# Patient Record
Sex: Male | Born: 1955 | Race: White | Hispanic: No | Marital: Married | Smoking: Never smoker
Health system: Southern US, Community
[De-identification: ages and names within clinical notes are randomized; demographics above are authoritative.]

## PROBLEM LIST (undated history)

## (undated) DIAGNOSIS — I639 Cerebral infarction, unspecified: Secondary | ICD-10-CM

## (undated) DIAGNOSIS — I4891 Unspecified atrial fibrillation: Secondary | ICD-10-CM

## (undated) DIAGNOSIS — I2510 Atherosclerotic heart disease of native coronary artery without angina pectoris: Secondary | ICD-10-CM

## (undated) DIAGNOSIS — E119 Type 2 diabetes mellitus without complications: Secondary | ICD-10-CM

## (undated) HISTORY — DX: Type 2 diabetes mellitus without complications: E11.9

## (undated) HISTORY — DX: Cerebral infarction, unspecified: I63.9

## (undated) HISTORY — PX: MANDIBLE FRACTURE SURGERY: SHX706

## (undated) HISTORY — PX: ATRIAL CARDIAC PACEMAKER INSERTION: SHX561

## (undated) HISTORY — PX: IMPLANTABLE CARDIOVERTER DEFIBRILLATOR IMPLANT: SHX5860

---

## 2011-05-30 DIAGNOSIS — Z87442 Personal history of urinary calculi: Secondary | ICD-10-CM | POA: Insufficient documentation

## 2011-05-30 DIAGNOSIS — G4700 Insomnia, unspecified: Secondary | ICD-10-CM | POA: Insufficient documentation

## 2011-05-30 DIAGNOSIS — G4733 Obstructive sleep apnea (adult) (pediatric): Secondary | ICD-10-CM | POA: Insufficient documentation

## 2011-05-30 DIAGNOSIS — F331 Major depressive disorder, recurrent, moderate: Secondary | ICD-10-CM | POA: Insufficient documentation

## 2011-05-30 DIAGNOSIS — Z95810 Presence of automatic (implantable) cardiac defibrillator: Secondary | ICD-10-CM | POA: Insufficient documentation

## 2011-05-30 DIAGNOSIS — I472 Ventricular tachycardia, unspecified: Secondary | ICD-10-CM | POA: Insufficient documentation

## 2011-11-04 DIAGNOSIS — D128 Benign neoplasm of rectum: Secondary | ICD-10-CM | POA: Insufficient documentation

## 2018-08-07 ENCOUNTER — Encounter (HOSPITAL_COMMUNITY): Payer: Self-pay | Admitting: Emergency Medicine

## 2018-08-07 ENCOUNTER — Emergency Department (HOSPITAL_COMMUNITY)
Admission: EM | Admit: 2018-08-07 | Discharge: 2018-08-07 | Disposition: A | Discharge status: Home / Self Care | Payer: BLUE CROSS/BLUE SHIELD | Attending: Emergency Medicine | Admitting: Emergency Medicine

## 2018-08-07 ENCOUNTER — Other Ambulatory Visit: Payer: Self-pay

## 2018-08-07 ENCOUNTER — Emergency Department (HOSPITAL_COMMUNITY): Payer: BLUE CROSS/BLUE SHIELD

## 2018-08-07 DIAGNOSIS — Z7901 Long term (current) use of anticoagulants: Secondary | ICD-10-CM | POA: Insufficient documentation

## 2018-08-07 DIAGNOSIS — I259 Chronic ischemic heart disease, unspecified: Secondary | ICD-10-CM | POA: Insufficient documentation

## 2018-08-07 DIAGNOSIS — Z95810 Presence of automatic (implantable) cardiac defibrillator: Secondary | ICD-10-CM | POA: Insufficient documentation

## 2018-08-07 DIAGNOSIS — R071 Chest pain on breathing: Secondary | ICD-10-CM | POA: Insufficient documentation

## 2018-08-07 DIAGNOSIS — I4891 Unspecified atrial fibrillation: Secondary | ICD-10-CM | POA: Diagnosis not present

## 2018-08-07 DIAGNOSIS — R079 Chest pain, unspecified: Secondary | ICD-10-CM | POA: Diagnosis present

## 2018-08-07 HISTORY — DX: Atherosclerotic heart disease of native coronary artery without angina pectoris: I25.10

## 2018-08-07 HISTORY — DX: Unspecified atrial fibrillation: I48.91

## 2018-08-07 LAB — BASIC METABOLIC PANEL
Anion gap: 6 (ref 5–15)
BUN: 13 mg/dL (ref 8–23)
CO2: 31 mmol/L (ref 22–32)
Calcium: 9.2 mg/dL (ref 8.9–10.3)
Chloride: 101 mmol/L (ref 98–111)
Creatinine, Ser: 1.19 mg/dL (ref 0.61–1.24)
GFR calc non Af Amer: >60 mL/min (ref >60)
Glucose, Bld: 233 mg/dL — ABNORMAL HIGH (ref 70–99)
Potassium: 4.4 mmol/L (ref 3.5–5.1)
Sodium: 138 mmol/L (ref 135–145)

## 2018-08-07 LAB — CBC
HCT: 47.2 % (ref 39.0–52.0)
Hemoglobin: 14.7 g/dL (ref 13.0–17.0)
MCH: 29.5 pg (ref 26.0–34.0)
MCHC: 31.1 g/dL (ref 30.0–36.0)
MCV: 94.6 fL (ref 78.0–100.0)
PLATELETS: 200 10*3/uL (ref 150–400)
RBC: 4.99 MIL/uL (ref 4.22–5.81)
RDW: 12.3 % (ref 11.5–15.5)
WBC: 5.6 10*3/uL (ref 4.0–10.5)

## 2018-08-07 LAB — I-STAT TROPONIN, ED
Troponin i, poc: 0.01 ng/mL (ref 0.00–0.08)
Troponin i, poc: 0.01 ng/mL (ref 0.00–0.08)

## 2018-08-07 LAB — BRAIN NATRIURETIC PEPTIDE: B Natriuretic Peptide: 11.7 pg/mL (ref 0.0–100.0)

## 2018-08-07 LAB — PROTIME-INR
INR: 0.92
Prothrombin Time: 12.3 seconds (ref 11.4–15.2)

## 2018-08-07 NOTE — ED Triage Notes (Signed)
Pt with stabbing pain and pressure in on left side chest w/o radiation this morning upon waking up. Hx ACID pacemaker and ablations. Pt a/o NAD at triage. Labs collected.

## 2018-08-07 NOTE — ED Provider Notes (Signed)
Patient placed in Quick Look pathway, seen and evaluated   Chief Complaint: chest pain  HPI:   Zachary Hines is a 62 y.o. male with hx of A-Fib and cardiac ablation who presents to the ED with chest pain. Patient reports that since th ablation his defibrillator has not fired. The pain the pain today is located in the left chest area. The pain started at 6:30 am and has gotten some better. Patient reports that his son is a cardiac nurse at [employer] and he told the patient to come to the ED for evaluation. Patient describes the pain as pushing, pressure. No n/v, no shortness of breath.  ROS: CV: chest pain  Physical Exam:  BP 132/82 (BP Location: Right Arm)   Pulse 85   Temp 98.2 F (36.8 C) (Oral)   Resp 16   SpO2 95%     Gen: No distress  Neuro: Awake and Alert  Skin: Warm and dry     Initiation of care has begun. The patient has been counseled on the process, plan, and necessity for staying for the completion/evaluation, and the remainder of the medical screening examination    Janne Napoleoneese, Fallen Crisostomo M, NP 08/07/18 1401    Arby BarrettePfeiffer, Marcy, MD 08/24/18 1359

## 2018-08-07 NOTE — ED Provider Notes (Signed)
[facility] Provider Note   CSN: [id number] Arrival date & time: 08/07/18  1333     History   Chief Complaint Chief Complaint  Patient presents with  . Chest Pain    HPI Zachary Hines is a 62 y.o. male.  The history is provided by the patient and a relative (son: cardiac nurse at [employer]).  Chest Pain   This is a new problem. The current episode started 6 to 12 hours ago. The problem occurs constantly. The problem has been gradually improving. The pain is associated with breathing. The pain is present in the lateral region (left). The pain is at a severity of 1/10. The pain is mild. The quality of the pain is described as dull and sharp (dull pain with a sharp component when patient took a deep breath). The pain does not radiate. The symptoms are aggravated by deep breathing. Pertinent negatives include no cough, no fever, no headaches, no nausea, no near-syncope, no shortness of breath and no sputum production. Treatments tried: Aspirin. The treatment provided no (patient reports pain was already improving when he took ASA) relief. Risk factors include male gender and obesity (and PMH PE). Family history comments: grandfather had lung cancer  Procedure history is positive for cardiac catheterization (per son, clean cath. Patient has no stents).    Past Medical History:  Diagnosis Date  . Atrial fibrillation (HCC)   . Coronary artery disease     There are no active problems to display for this patient.   Past Surgical History:  Procedure Laterality Date  . ATRIAL CARDIAC PACEMAKER INSERTION    . IMPLANTABLE CARDIOVERTER DEFIBRILLATOR IMPLANT    . MANDIBLE FRACTURE SURGERY        Home Medications    Prior to Admission medications   Not on File    Family History No family history on file.  Social History Social History   Tobacco Use  . Smoking status: Never Smoker  . Smokeless tobacco: Never Used  Substance Use Topics  .  Alcohol use: Not Currently  . Drug use: Not Currently     Allergies   Patient has no known allergies.   Review of Systems Review of Systems  Constitutional: Negative for chills and fever.  HENT: Negative for congestion.   Eyes: Negative for pain.  Respiratory: Negative for cough, sputum production and shortness of breath.   Cardiovascular: Positive for chest pain. Negative for near-syncope.  Gastrointestinal: Negative for nausea.  Genitourinary: Negative for flank pain.  Musculoskeletal: Positive for arthralgias (left lower leg pain).  Skin: Positive for wound (abrasion to left medial ankle).  Neurological: Negative for headaches.     Physical Exam Updated Vital Signs BP 128/83   Pulse 74   Temp 98.2 F (36.8 C) (Oral)   Resp 20   Ht 5\' 10"  (1.778 m)   Wt 120.2 kg   SpO2 94%   BMI 38.02 kg/m   Physical Exam  Constitutional: He appears well-developed and well-nourished.  HENT:  Head: Normocephalic and atraumatic.  Eyes: Conjunctivae are normal.  Neck: Neck supple.  Cardiovascular: Normal rate and regular rhythm.  No murmur heard. Pulmonary/Chest: Effort normal. No respiratory distress. He has decreased breath sounds. He has no wheezes. He has no rales.  Abdominal: Soft. There is no tenderness.  Musculoskeletal:       Right lower leg: He exhibits edema (2+).       Left lower leg: He exhibits edema (2+).  Neurological: He is alert.  Skin: Skin is warm and dry.  Psychiatric: He has a normal mood and affect.  Nursing note and vitals reviewed.    ED Treatments / Results  Labs (all labs ordered are listed, but only abnormal results are displayed) Labs Reviewed  BASIC METABOLIC PANEL - Abnormal; Notable for the following components:      Result Value   Glucose, Bld 233 (*)    All other components within normal limits  CBC  PROTIME-INR  BRAIN NATRIURETIC PEPTIDE  I-STAT TROPONIN, ED  I-STAT TROPONIN, ED    EKG EKG Interpretation  Date/Time:  Friday  August 07 2018 13:42:05 EDT Ventricular Rate:  90 PR Interval:  168 QRS Duration: 82 QT Interval:  358 QTC Calculation: 437 R Axis:   43 Text Interpretation:  Normal sinus rhythm Normal ECG No old tracing to compare Confirmed by Azalia Bilis ([id number]) on 08/07/2018 6:09:41 PM   Radiology Dg Chest 2 View  Result Date: 08/07/2018 CLINICAL DATA:  Left-sided chest pain. EXAM: CHEST - 2 VIEW COMPARISON:  None. FINDINGS: Single lead left chest wall pacemaker. The heart is at the upper limits of normal in size. Normal pulmonary vascularity. Low lung volumes with bibasilar atelectasis. No focal consolidation, pleural effusion, or pneumothorax. No acute osseous abnormality. IMPRESSION: Bibasilar atelectasis. Electronically Signed   By: Obie Dredge M.D.   On: 08/07/2018 15:01    Procedures Procedures (including critical care time)  Medications Ordered in ED Medications - No data to display   Initial Impression / Assessment and Plan / ED Course  I have reviewed the triage vital signs and the nursing notes.  Pertinent labs & imaging results that were available during my care of the patient were reviewed by me and considered in my medical decision making (see chart for details).      The patient is a 61yoM with PMH Afib s/p ablation and PE (on Xarelto) who presents with chest pain on awakening. Patient took 2 325mg  tablets of aspirin. The patient reports that the pain has improved over the course of the day and the only component he has now is a sharp component when taking a deep breath. Delta troponin negative and EKG shows no STEMI, not suspect ACS. HEART score less than 3. Per 06/2018 cardiology office visit with Dr. Beatrix Fetters, in 2014, patient had a coronary angiogram after having several runs of VT, which demonstrated no significant CAD. Do not suspect PE as patient is anticoagulated, has no recent surgeries, cancers, HR less than 100 and SpO2 greater than 95%. The patient denies intractable  emesis or hematemesis, do not suspect esophageal perforation. Patient has equal pulses and no widened mediastinum on CXR, do not suspect aortic dissection. CXR reveals no PNA and patient denies fevers, chills, coughing, and shortness of breath.  Do not suspect emergent etiology of chest pain. Patient informed of results and patient agrees to follow up with PCP and cardiology as soon as possible. Patient reports understanding of plan and return precautions.  Care supervised by Dr. Patria Mane.  Nash Dimmer, MD  Final Clinical Impressions(s) / ED Diagnoses   Final diagnoses:  Chest pain on breathing    ED Discharge Orders    None       Nash Dimmer, MD 08/08/18 1259    Azalia Bilis, MD 08/10/18 2314

## 2018-08-07 NOTE — ED Notes (Signed)
Recollect Blue top per lab.

## 2020-01-31 IMAGING — DX DG CHEST 2V
2 series · 2 of 2 positions shown · non-contrast
Comparison: None.

CLINICAL DATA: Left-sided chest pain.

EXAM:
CHEST - 2 VIEW

[chest pa]
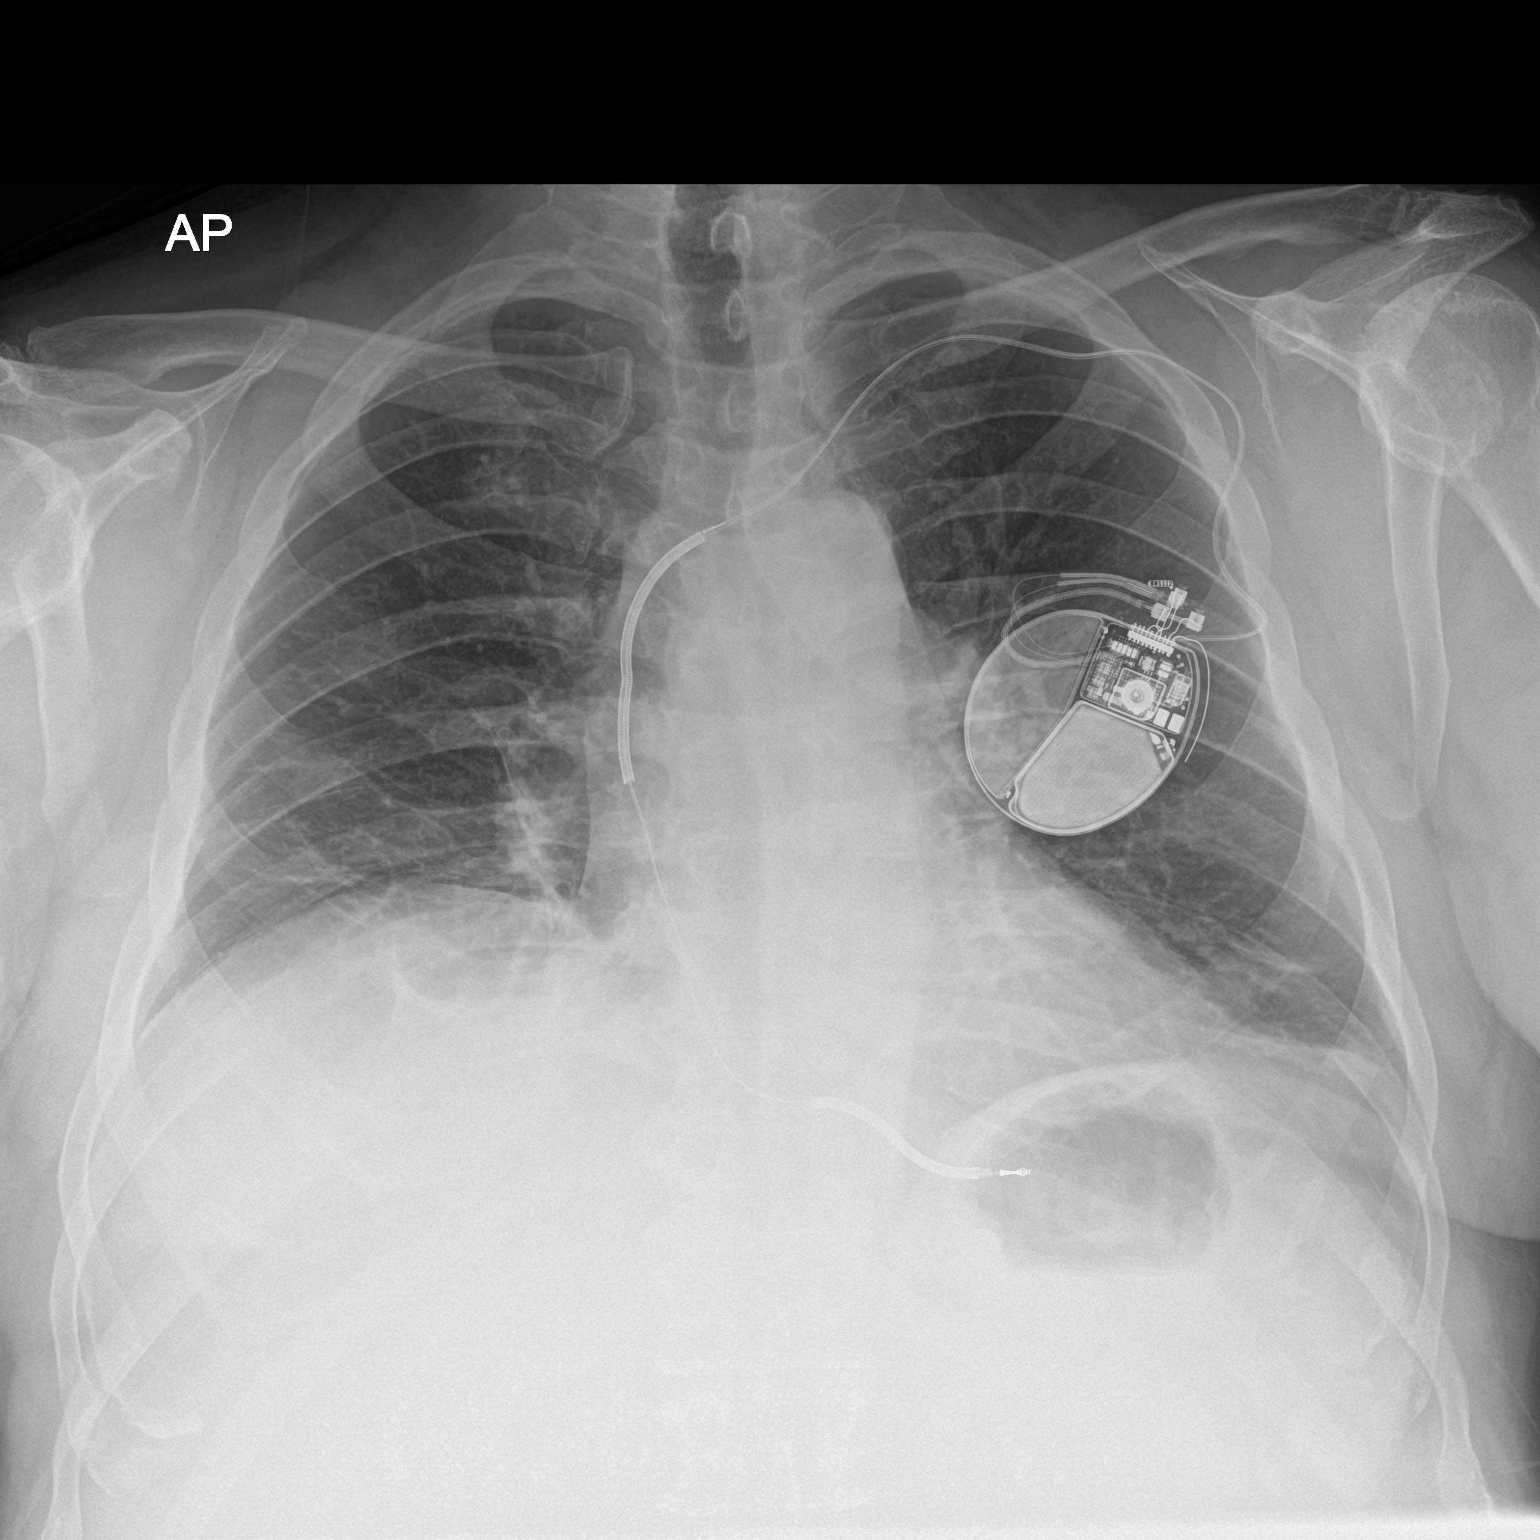

[chest lat]
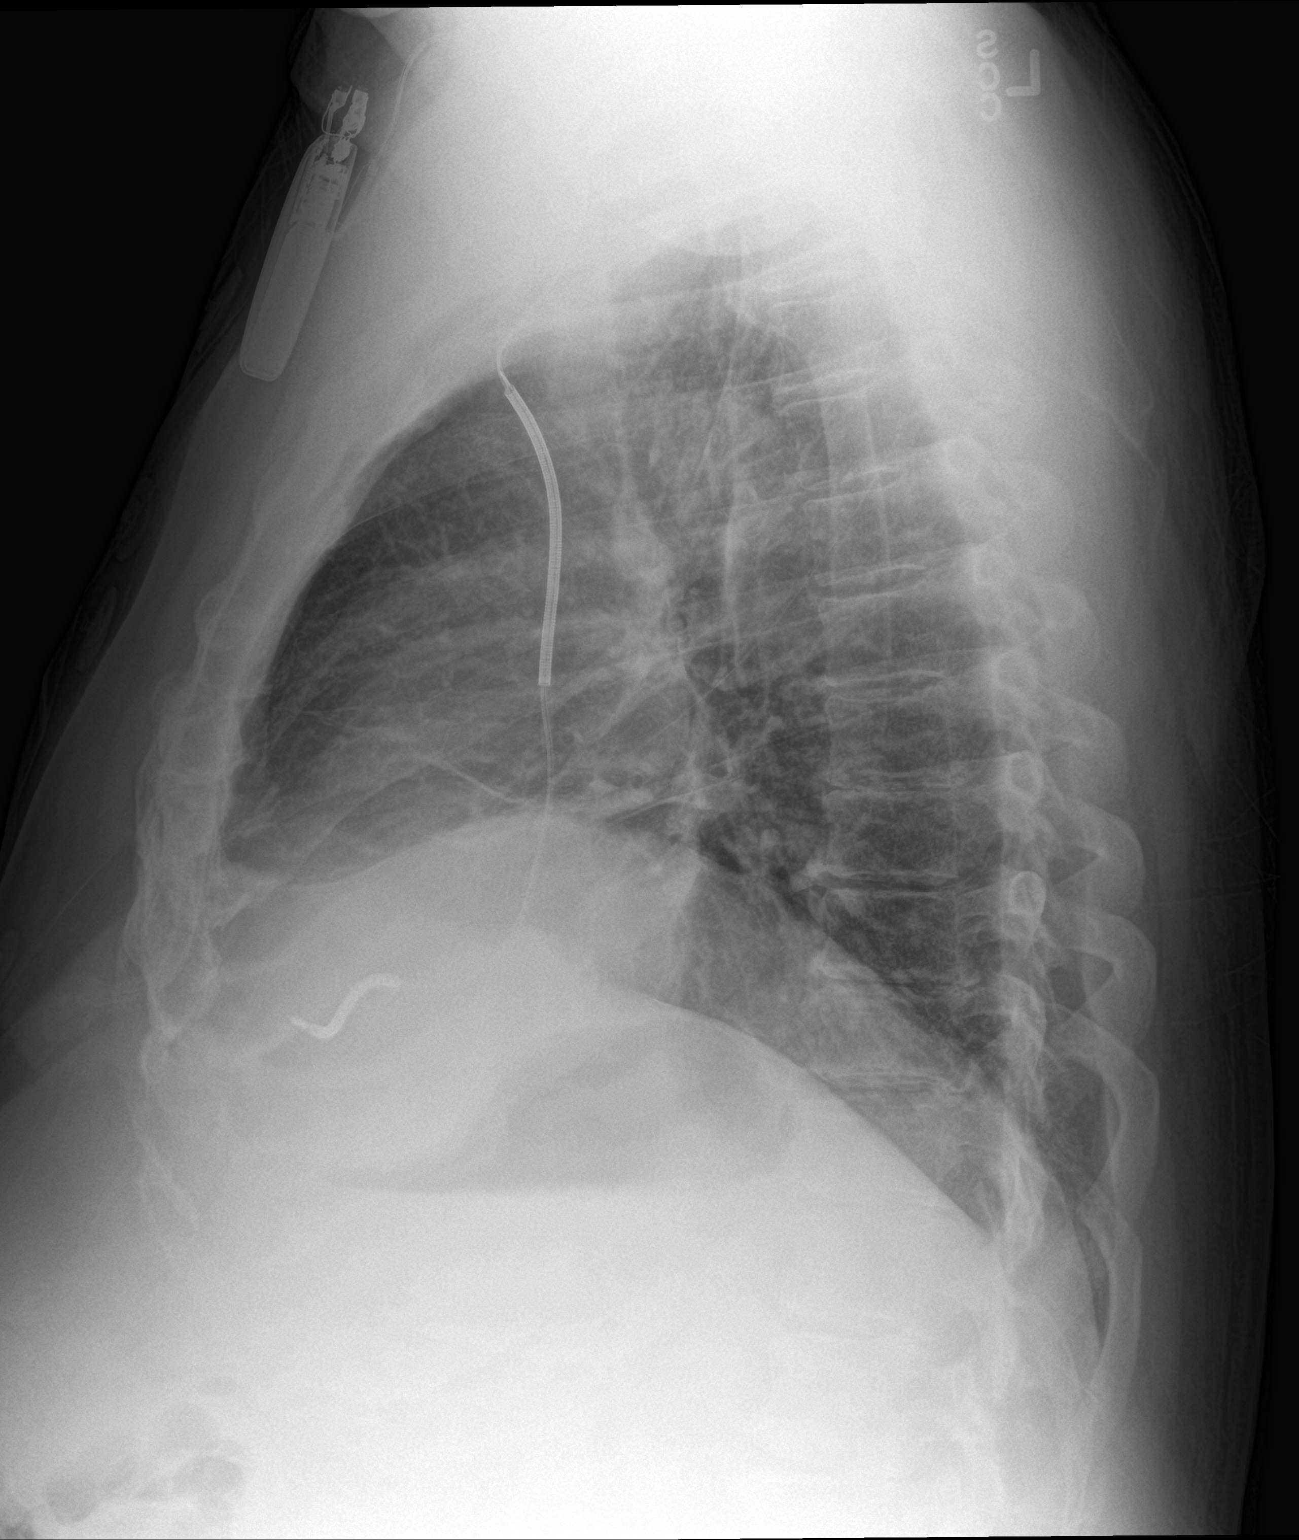

[2 of 2 positions shown; findings below may reference images not displayed]

FINDINGS: Single lead left chest wall pacemaker. The heart is at the upper
limits of normal in size. Normal pulmonary vascularity. Low lung
volumes with bibasilar atelectasis. No focal consolidation, pleural
effusion, or pneumothorax. No acute osseous abnormality.
IMPRESSION: Bibasilar atelectasis.

## 2021-01-29 DIAGNOSIS — I10 Essential (primary) hypertension: Secondary | ICD-10-CM | POA: Insufficient documentation

## 2021-02-04 DIAGNOSIS — E1165 Type 2 diabetes mellitus with hyperglycemia: Secondary | ICD-10-CM | POA: Insufficient documentation

## 2021-06-19 DIAGNOSIS — G8929 Other chronic pain: Secondary | ICD-10-CM | POA: Insufficient documentation

## 2021-06-19 DIAGNOSIS — G5601 Carpal tunnel syndrome, right upper limb: Secondary | ICD-10-CM | POA: Insufficient documentation

## 2022-01-17 DIAGNOSIS — G459 Transient cerebral ischemic attack, unspecified: Secondary | ICD-10-CM | POA: Insufficient documentation

## 2024-02-20 ENCOUNTER — Encounter: Payer: Self-pay | Admitting: Nurse Practitioner

## 2024-02-20 ENCOUNTER — Ambulatory Visit (INDEPENDENT_AMBULATORY_CARE_PROVIDER_SITE_OTHER): Admitting: Nurse Practitioner

## 2024-02-20 VITALS — BP 104/64 | HR 70 | Temp 97.6°F | Resp 16 | Ht 70.0 in | Wt 185.0 lb

## 2024-02-20 DIAGNOSIS — F331 Major depressive disorder, recurrent, moderate: Secondary | ICD-10-CM

## 2024-02-20 DIAGNOSIS — Z1212 Encounter for screening for malignant neoplasm of rectum: Secondary | ICD-10-CM

## 2024-02-20 DIAGNOSIS — E1169 Type 2 diabetes mellitus with other specified complication: Secondary | ICD-10-CM

## 2024-02-20 DIAGNOSIS — Z1211 Encounter for screening for malignant neoplasm of colon: Secondary | ICD-10-CM

## 2024-02-20 DIAGNOSIS — G4709 Other insomnia: Secondary | ICD-10-CM

## 2024-02-20 DIAGNOSIS — Z8673 Personal history of transient ischemic attack (TIA), and cerebral infarction without residual deficits: Secondary | ICD-10-CM

## 2024-02-20 DIAGNOSIS — E785 Hyperlipidemia, unspecified: Secondary | ICD-10-CM

## 2024-02-20 NOTE — Progress Notes (Signed)
 [facility] [address] [place], [place] [address]  Internal MEDICINE  Office Visit Note  Patient Name: Zachary Hines  [id number]  [id number]  Date of Service: 02/20/2024   Complaints/HPI Pt is here for establishment of PCP. Chief Complaint  Patient presents with   New Patient (Initial Visit)   Diabetes   Depression    HPI Zachary Hines presents for a new patient visit to establish care.  Well-appearing 68 y.o. male with diabetes, hyperlipidemia, hypertension, CHF, persistent atrial fib, OSA s/p maxillomandibular advancement surgery, issues with bowel and bladder incontinence  Work: retired Home:lives at home with husband Diet: fair Exercise: walking Tobacco use: none Alcohol use: none  Illicit drug use: none Routine CRC screening: not sure when this is due Eye exam: reports that he has an eye doctor foot exam: will be done with annual visit.  Labs: had labs done recently New or worsening pain: none Has a single chamber ICD, also had an AFIB ablation in 2019, getting ICD battery changed out next year Severe depression, needs psych referral AWV done in January this year .   Current Medication: Outpatient Encounter Medications as of 02/20/2024  Medication Sig   aspirin 81 MG chewable tablet Chew 81 mg by mouth daily.   [DISCONTINUED] empagliflozin (JARDIANCE) 25 MG TABS tablet Take 25 mg by mouth daily.   [DISCONTINUED] liraglutide (VICTOZA) 18 MG/3ML SOPN Inject 0.6 mg into the skin daily.   [DISCONTINUED] sertraline (ZOLOFT) 100 MG tablet Take 100 mg by mouth daily.   [DISCONTINUED] traZODone (DESYREL) 100 MG tablet Take 100 mg by mouth at bedtime.   empagliflozin (JARDIANCE) 25 MG TABS tablet Take 1 tablet (25 mg total) by mouth daily.   liraglutide (VICTOZA) 18 MG/3ML SOPN Inject 1.2 mg into the skin daily.   sertraline (ZOLOFT) 100 MG tablet Take 1 tablet (100 mg total) by mouth daily.   traZODone (DESYREL) 100 MG tablet Take 1 tablet (100 mg total) by mouth at  bedtime.   No facility-administered encounter medications on file as of 02/20/2024.    Surgical History: Past Surgical History:  Procedure Laterality Date   ATRIAL CARDIAC PACEMAKER INSERTION     IMPLANTABLE CARDIOVERTER DEFIBRILLATOR IMPLANT     MANDIBLE FRACTURE SURGERY      Medical History: Past Medical History:  Diagnosis Date   Atrial fibrillation (HCC)    Coronary artery disease    Diabetes mellitus without complication (HCC)    Stroke (HCC)     Family History: History reviewed. No pertinent family history.  Social History   Socioeconomic History   Marital status: Married    Spouse name: Not on file   Number of children: Not on file   Years of education: Not on file   Highest education level: Not on file  Occupational History   Not on file  Tobacco Use   Smoking status: Never   Smokeless tobacco: Never  Substance and Sexual Activity   Alcohol use: Not Currently   Drug use: Not Currently   Sexual activity: Not Currently  Other Topics Concern   Not on file  Social History Narrative   Not on file   Social Drivers of Health   Financial Resource Strain: Low Risk  (09/20/2022)   Received from [facility]   Overall Financial Resource Strain (CARDIA)    Difficulty of Paying Living Expenses: Not hard at all  Food Insecurity: No Food Insecurity (09/20/2022)   Received from [facility]   Hunger Vital Sign  Worried About Programme researcher, broadcasting/film/video in the Last Year: Never true    Ran Out of Food in the Last Year: Never true  Transportation Needs: No Transportation Needs (09/20/2022)   Received from [facility] - Transportation    Lack of Transportation (Medical): No    Lack of Transportation (Non-Medical): No  Physical Activity: Not on file  Stress: Not on file  Social Connections: Socially Integrated (01/18/2022)   Received from [facility]   Social Connection and Isolation Panel [NHANES]    Frequency of Communication with Friends and  Family: More than three times a week    Frequency of Social Gatherings with Friends and Family: Never    Attends Religious Services: More than 4 times per year    Active Member of [employer] or Organizations: Yes    Attends Banker Meetings: Never    Marital Status: Married  Catering manager Violence: Not At Risk (01/18/2022)   Received from [facility]   Humiliation, Afraid, Rape, and Kick questionnaire    Fear of Current or Ex-Partner: No    Emotionally Abused: No    Physically Abused: No    Sexually Abused: No     Review of Systems  Constitutional: Negative.  Negative for chills, fatigue and unexpected weight change.  HENT:  Negative for congestion, postnasal drip, rhinorrhea, sneezing and sore throat.   Eyes:  Negative for redness.  Respiratory: Negative.  Negative for cough, chest tightness, shortness of breath and wheezing.   Cardiovascular: Negative.  Negative for chest pain and palpitations.  Gastrointestinal: Negative.  Negative for abdominal pain, constipation, diarrhea, nausea and vomiting.  Genitourinary:  Negative for dysuria and frequency.  Musculoskeletal: Negative.  Negative for arthralgias, back pain, joint swelling and neck pain.  Skin:  Negative for rash.  Neurological: Negative.  Negative for tremors and numbness.  Hematological:  Negative for adenopathy. Does not bruise/bleed easily.  Psychiatric/Behavioral:  Negative for behavioral problems (Depression), sleep disturbance and suicidal ideas. The patient is not nervous/anxious.     Vital Signs: BP 104/64   Pulse 70   Temp 97.6 F (36.4 C)   Resp 16   Ht 5\' 10"  (1.778 m)   Wt 185 lb (83.9 kg)   SpO2 96%   BMI 26.54 kg/m    Physical Exam Vitals reviewed.  Constitutional:      General: He is not in acute distress.    Appearance: Normal appearance. He is well-developed, well-groomed and overweight. He is not ill-appearing.  HENT:     Head: Normocephalic and atraumatic.  Eyes:     Pupils:  Pupils are equal, round, and reactive to light.  Cardiovascular:     Rate and Rhythm: Normal rate and regular rhythm.  Pulmonary:     Effort: Pulmonary effort is normal. No respiratory distress.  Skin:    Capillary Refill: Capillary refill takes less than 2 seconds.  Neurological:     Mental Status: He is alert and oriented to person, place, and time.  Psychiatric:        Mood and Affect: Mood normal.        Behavior: Behavior normal.       Assessment/Plan: 1. Type 2 diabetes mellitus with other specified complication, without long-term current use of insulin (HCC) (Primary) Stable A1c at 5.1 in January. Continue jardiance and victoza as prescribed. Labs ordered for next office visit.  - empagliflozin (JARDIANCE) 25 MG TABS tablet; Take 1 tablet (25 mg total) by  mouth daily.  Dispense: 30 tablet; Refill: 11 - liraglutide (VICTOZA) 18 MG/3ML SOPN; Inject 1.2 mg into the skin daily.  Dispense: 3 mL; Refill: 11 - CBC with Differential/Platelet - CMP14+EGFR - Urine Microalbumin w/creat. ratio  2. Hyperlipidemia associated with type 2 diabetes mellitus (HCC) Not currently on statin therapy. Continue diet and lifestyle modifications   3. Other insomnia Continue trazodone as prescribed.  - traZODone (DESYREL) 100 MG tablet; Take 1 tablet (100 mg total) by mouth at bedtime.  Dispense: 90 tablet; Refill: 3  4. History of TIA (transient ischemic attack) Routine labs ordered. Continue baby aspirin as prescribed.  - aspirin 81 MG chewable tablet; Chew 81 mg by mouth daily. - CBC with Differential/Platelet - CMP14+EGFR - Urine Microalbumin w/creat. ratio  5. Screening for colorectal cancer Cologuard test ordered  - Cologuard  6. Moderate episode of recurrent major depressive disorder (HCC) Referred to psychiatry. Continue sertraline and trazodone as prescribed  - Ambulatory referral to Psychiatry - sertraline (ZOLOFT) 100 MG tablet; Take 1 tablet (100 mg total) by mouth daily.   Dispense: 90 tablet; Refill: 3 - traZODone (DESYREL) 100 MG tablet; Take 1 tablet (100 mg total) by mouth at bedtime.  Dispense: 90 tablet; Refill: 3    General Counseling: Zachary Hines verbalizes understanding of the findings of todays visit and agrees with plan of treatment. I have discussed any further diagnostic evaluation that may be needed or ordered today. We also reviewed his medications today. he has been encouraged to call the office with any questions or concerns that should arise related to todays visit.    Orders Placed This Encounter  Procedures   Cologuard   Ambulatory referral to Psychiatry    Meds ordered this encounter  Medications   empagliflozin (JARDIANCE) 25 MG TABS tablet    Sig: Take 1 tablet (25 mg total) by mouth daily.    Dispense:  30 tablet    Refill:  11    Discontinue all previous orders for jardiance and keep this script on file for future refills. I am taking over as his PCP and his prescriptions will come from me.   liraglutide (VICTOZA) 18 MG/3ML SOPN    Sig: Inject 1.2 mg into the skin daily.    Dispense:  3 mL    Refill:  11    Discontinue all previous orders for victoza, keep this script on file for future refills. I am taking over his prescriptions as his PCP   sertraline (ZOLOFT) 100 MG tablet    Sig: Take 1 tablet (100 mg total) by mouth daily.    Dispense:  90 tablet    Refill:  3    Discontinue all previous orders for sertraline, keep this script on file for future refills. I am taking over his prescriptions as his PCP   traZODone (DESYREL) 100 MG tablet    Sig: Take 1 tablet (100 mg total) by mouth at bedtime.    Dispense:  90 tablet    Refill:  3    Discontinue all previous orders for trazodone, keep this script on file for future refills. I am taking over his prescriptions as his PCP    Return in about 6 months (around 08/22/2024) for F/U, Recheck A1C, Amanii Snethen PCP.  Time spent:30 Minutes Time spent with patient included reviewing progress  notes, labs, imaging studies, and discussing plan for follow up.   [place] Controlled Substance Database was reviewed by me for overdose risk score (ORS)   This patient was seen by  Laurence Pons, FNP-C in collaboration with Dr. Verneta Gone as a part of collaborative care agreement.   Jaidon Sponsel R. Bobbi Burow, MSN, FNP-C Internal Medicine

## 2024-03-16 NOTE — Progress Notes (Deleted)
 Psychiatric Initial Adult Assessment   Patient Identification: Zachary Hines MRN:  [id number] Date of Evaluation:  03/16/2024 Referral Source: *** Chief Complaint:  No chief complaint on file.  Visit Diagnosis: No diagnosis found.  History of Present Illness:   Zachary Hines is a 68 y.o. 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    Sertraline 150 mg daily  Was seen at [facility]    Associated Signs/Symptoms: Depression Symptoms:  {DEPRESSION SYMPTOMS:20000} (Hypo) Manic Symptoms:  {BHH MANIC SYMPTOMS:22872} Anxiety Symptoms:  {BHH ANXIETY SYMPTOMS:22873} Psychotic Symptoms:  {BHH PSYCHOTIC SYMPTOMS:22874} PTSD Symptoms: {BHH PTSD SYMPTOMS:22875}  Past Psychiatric History:  Outpatient:  Psychiatry admission:  Previous suicide attempt:  Past trials of medication:  History of violence:  History of head injury:   Previous Psychotropic Medications: {YES/NO:21197}  Substance Abuse History in the last 12 months:  {yes no:314532}  Consequences of Substance Abuse: {BHH CONSEQUENCES OF SUBSTANCE ABUSE:22880}  Past Medical History:  Past Medical History:  Diagnosis Date   Atrial fibrillation (HCC)    Coronary artery disease    Diabetes mellitus without complication (HCC)    Stroke (HCC)     Past Surgical History:  Procedure Laterality Date   ATRIAL CARDIAC PACEMAKER INSERTION     IMPLANTABLE CARDIOVERTER DEFIBRILLATOR IMPLANT     MANDIBLE FRACTURE SURGERY      Family Psychiatric History: ***  Family History: No family history on file.  Social History:   Social History   Socioeconomic History   Marital status: Married    Spouse name: Not on file   Number of children: Not on file   Years of education: Not on file   Highest education level: Not on file  Occupational History   Not on file  Tobacco Use   Smoking status: Never   Smokeless tobacco: Never  Substance and Sexual Activity    Alcohol use: Not Currently   Drug use: Not Currently   Sexual activity: Not Currently  Other Topics Concern   Not on file  Social History Narrative   Not on file   Social Drivers of Health   Financial Resource Strain: Low Risk  (09/20/2022)   Received from [facility]   Overall Financial Resource Strain (CARDIA)    Difficulty of Paying Living Expenses: Not hard at all  Food Insecurity: No Food Insecurity (09/20/2022)   Received from [facility]   Hunger Vital Sign    Worried About Running Out of Food in the Last Year: Never true    Ran Out of Food in the Last Year: Never true  Transportation Needs: No Transportation Needs (09/20/2022)   Received from [facility] - Transportation    Lack of Transportation (Medical): No    Lack of Transportation (Non-Medical): No  Physical Activity: Not on file  Stress: Not on file  Social Connections: Socially Integrated (01/18/2022)   Received from [facility]   Social Connection and Isolation Panel [NHANES]    Frequency of Communication with Friends and Family: More than three times a week    Frequency of Social Gatherings with Friends and Family: Never    Attends Religious Services: More than 4 times per year    Active Member of [employer] or Organizations: Yes    Attends Banker Meetings: Never    Marital Status: Married    Additional Social History: ***  Allergies:  No Known Allergies  Metabolic  Disorder Labs: No results found for: "HGBA1C", "MPG" No results found for: "PROLACTIN" No results found for: "CHOL", "TRIG", "HDL", "CHOLHDL", "VLDL", "LDLCALC" No results found for: "TSH"  Therapeutic Level Labs: No results found for: "LITHIUM" No results found for: "CBMZ" No results found for: "VALPROATE"  Current Medications: Current Outpatient Medications  Medication Sig Dispense Refill   aspirin 81 MG chewable tablet Chew 81 mg by mouth daily.     empagliflozin (JARDIANCE) 25 MG TABS tablet Take  25 mg by mouth daily.     liraglutide (VICTOZA) 18 MG/3ML SOPN Inject 0.6 mg into the skin daily.     sertraline (ZOLOFT) 100 MG tablet Take 100 mg by mouth daily.     traZODone (DESYREL) 100 MG tablet Take 100 mg by mouth at bedtime.     No current facility-administered medications for this visit.    Musculoskeletal: Strength & Muscle Tone: within normal limits Gait & Station: normal Patient leans: N/A  Psychiatric Specialty Exam: Review of Systems  There were no vitals taken for this visit.There is no height or weight on file to calculate BMI.  General Appearance: {Appearance:22683}  Eye Contact:  {BHH EYE CONTACT:22684}  Speech:  Clear and Coherent  Volume:  Normal  Mood:  {BHH MOOD:22306}  Affect:  {Affect (PAA):22687}  Thought Process:  Coherent  Orientation:  Full (Time, Place, and Person)  Thought Content:  Logical  Suicidal Thoughts:  {ST/HT (PAA):22692}  Homicidal Thoughts:  {ST/HT (PAA):22692}  Memory:  Immediate;   Good  Judgement:  {Judgement (PAA):22694}  Insight:  {Insight (PAA):22695}  Psychomotor Activity:  Normal  Concentration:  Concentration: Good and Attention Span: Good  Recall:  Good  Fund of Knowledge:Good  Language: Good  Akathisia:  No  Handed:  Right  AIMS (if indicated):  not done  Assets:  Communication Skills Desire for Improvement  ADL's:  Intact  Cognition: WNL  Sleep:  {BHH GOOD/FAIR/POOR:22877}   Screenings:   Assessment and Plan:    Plan   The patient demonstrates the following risk factors for suicide: Chronic risk factors for suicide include: {Chronic Risk Factors for WUJWJXB:14782956}. Acute risk factors for suicide include: {Acute Risk Factors for OZHYQMV:78469629}. Protective factors for this patient include: {Protective Factors for Suicide BMWU:13244010}. Considering these factors, the overall suicide risk at this point appears to be {Desc; low/moderate/high:110033}. Patient {ACTION; IS/IS UVO:53664403} appropriate for  outpatient follow up.   Collaboration of Care: {BH OP Collaboration of Care:21014065}  Patient/Guardian was advised Release of Information must be obtained prior to any record release in order to collaborate their care with an outside provider. Patient/Guardian was advised if they have not already done so to contact the registration department to sign all necessary forms in order for [place] to release information regarding their care.   Consent: Patient/Guardian gives verbal consent for treatment and assignment of benefits for services provided during this visit. Patient/Guardian expressed understanding and agreed to proceed.   Neysa Hotter, MD 4/8/20258:15 AM

## 2024-03-22 ENCOUNTER — Ambulatory Visit: Admitting: Psychiatry

## 2024-04-03 ENCOUNTER — Encounter: Payer: Self-pay | Admitting: Nurse Practitioner

## 2024-04-03 DIAGNOSIS — Z8673 Personal history of transient ischemic attack (TIA), and cerebral infarction without residual deficits: Secondary | ICD-10-CM | POA: Insufficient documentation

## 2024-04-03 DIAGNOSIS — E119 Type 2 diabetes mellitus without complications: Secondary | ICD-10-CM | POA: Insufficient documentation

## 2024-04-03 LAB — ESTIMATED GFR: eGFR: >90

## 2024-04-03 LAB — HEMOGLOBIN A1C: Hemoglobin A1C: 5.1

## 2024-04-03 MED ORDER — EMPAGLIFLOZIN 25 MG PO TABS: 25.0000 mg | ORAL_TABLET | Freq: Every day | ORAL | 11 refills | Status: AC

## 2024-04-03 MED ORDER — TRAZODONE HCL 100 MG PO TABS: 100.0000 mg | ORAL_TABLET | Freq: Every day | ORAL | 3 refills | Status: AC

## 2024-04-03 MED ORDER — SERTRALINE HCL 100 MG PO TABS
100.0000 mg | ORAL_TABLET | Freq: Every day | ORAL | 3 refills | Status: AC
Start: 2024-04-03 — End: ?

## 2024-04-03 MED ORDER — LIRAGLUTIDE 18 MG/3ML ~~LOC~~ SOPN: 1.2000 mg | PEN_INJECTOR | Freq: Every day | SUBCUTANEOUS | 11 refills | Status: AC

## 2024-04-23 LAB — COLOGUARD

## 2024-09-02 ENCOUNTER — Ambulatory Visit: Admitting: Nurse Practitioner
